# Patient Record
Sex: Male | Born: 1950 | Race: White | Hispanic: No | Marital: Married
Health system: Southern US, Community
[De-identification: ages and names within clinical notes are randomized; demographics above are authoritative.]

---

## 2004-09-15 ENCOUNTER — Ambulatory Visit: Payer: Self-pay | Admitting: Internal Medicine

## 2004-09-17 ENCOUNTER — Ambulatory Visit (HOSPITAL_COMMUNITY): Admission: RE | Admit: 2004-09-17 | Discharge: 2004-09-17 | Payer: Self-pay | Admitting: Internal Medicine

## 2004-10-20 ENCOUNTER — Ambulatory Visit: Payer: Self-pay | Admitting: Internal Medicine

## 2005-04-06 ENCOUNTER — Ambulatory Visit (HOSPITAL_COMMUNITY): Admission: RE | Admit: 2005-04-06 | Discharge: 2005-04-06 | Payer: Self-pay | Admitting: Internal Medicine

## 2005-07-06 ENCOUNTER — Ambulatory Visit: Payer: Self-pay | Admitting: Internal Medicine

## 2005-08-13 ENCOUNTER — Ambulatory Visit (HOSPITAL_COMMUNITY): Admission: RE | Admit: 2005-08-13 | Discharge: 2005-08-13 | Payer: Self-pay | Admitting: Internal Medicine

## 2005-08-13 ENCOUNTER — Ambulatory Visit: Payer: Self-pay | Admitting: Internal Medicine

## 2006-07-26 IMAGING — RF DG ESOPHAGUS
9 of 10 series · 15 of 24 positions shown · non-contrast
Comparison: None

CLINICAL DATA: Recurrent dysphagia.  History of esophageal dilatation.
 DIAGNOSTIC ESOPHAGRAM:

[Series 1: run · 3 of 14 slices shown (1 of 9)]
[im 1/14]
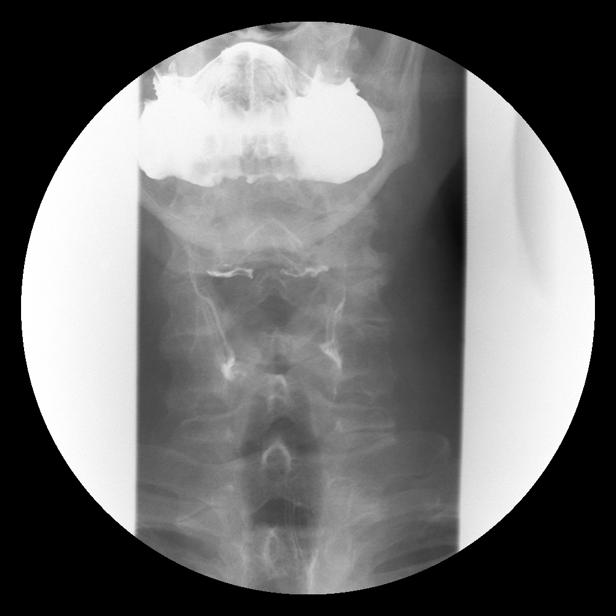
[im 7/14]
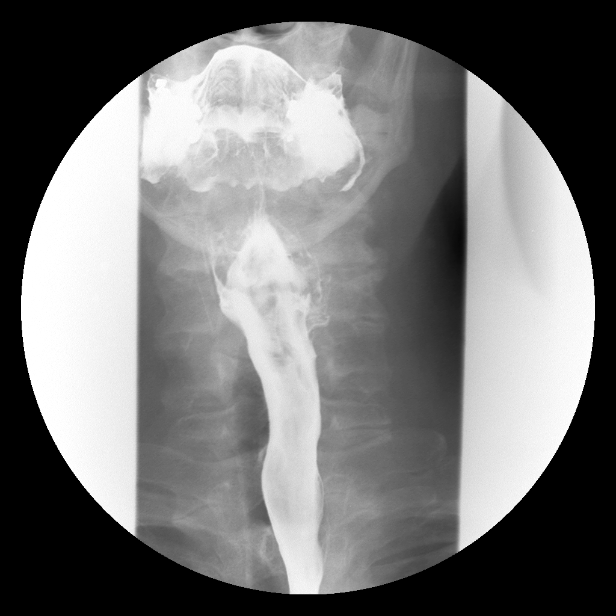
[im 14/14]
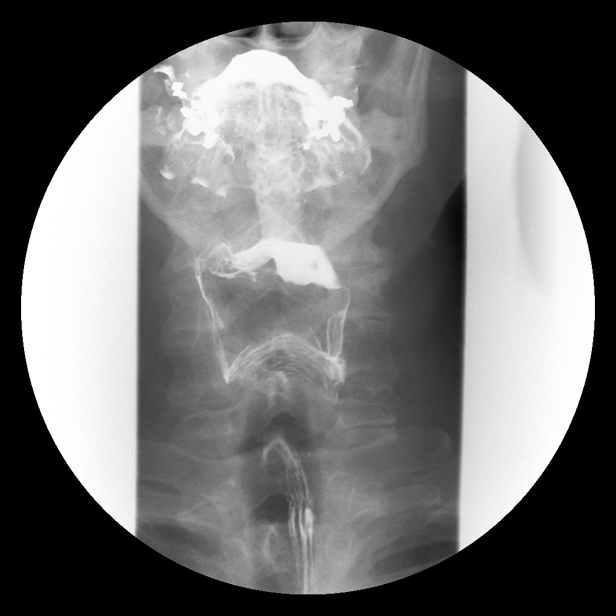

[Series 1: run · 3 of 12 slices shown (2 of 9)]
[im 1/12]
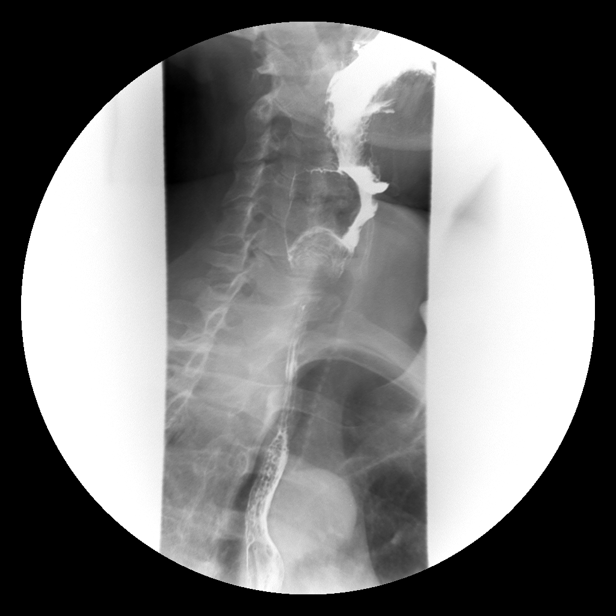
[im 8/12]
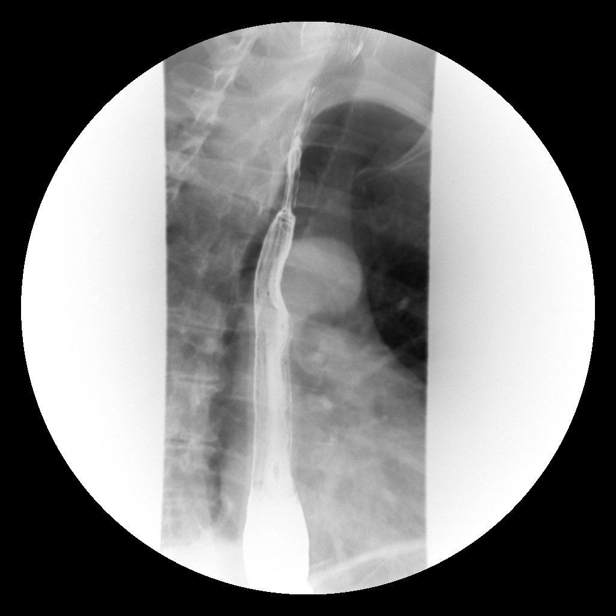
[im 12/12]
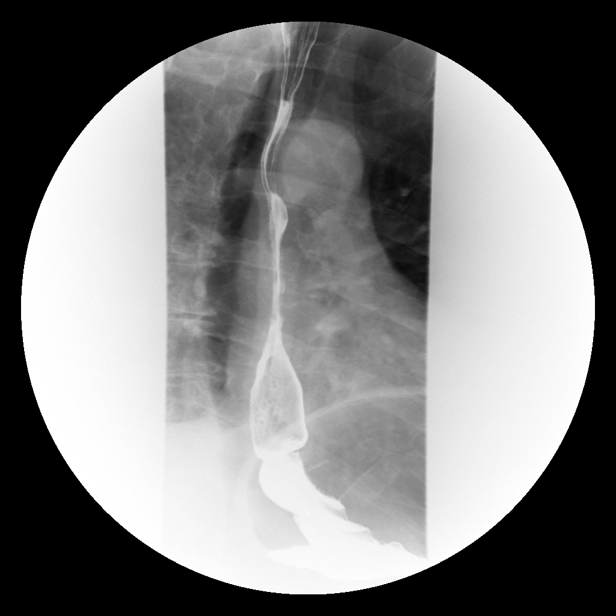

[Series 2: run · 1 of 7 slices shown (3 of 9)]
[im 4/7]
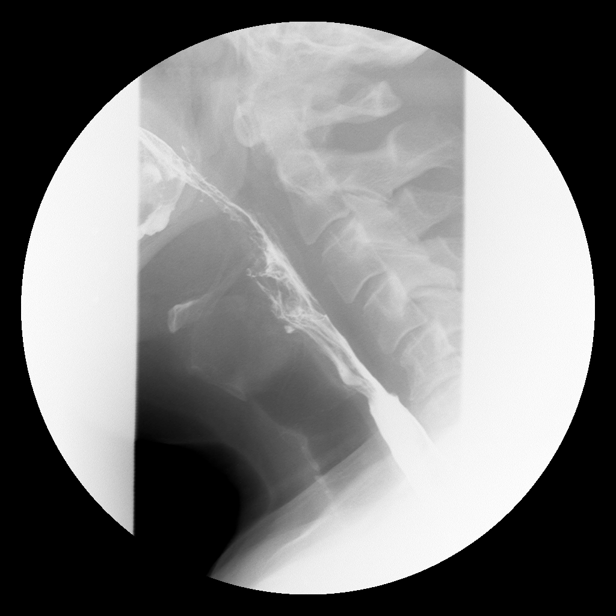

[Series 3: run · 1 of 2 slices shown (4 of 9)]
[im 1/2]
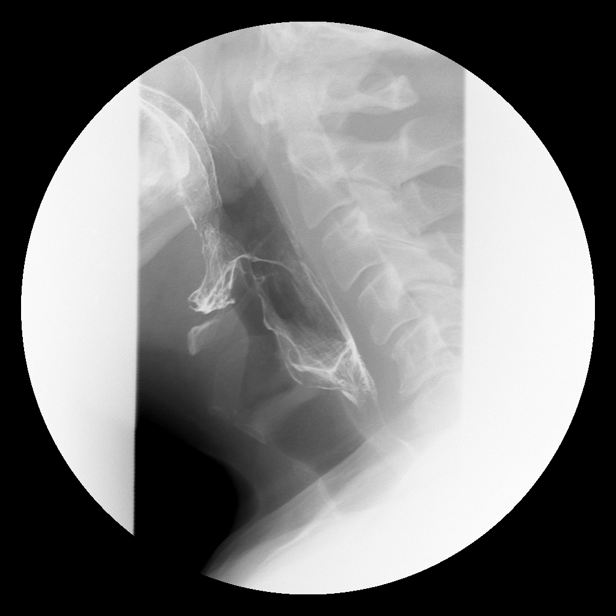

[Series 4: run · 1 of 2 slices shown (5 of 9)]
[im 1/2]
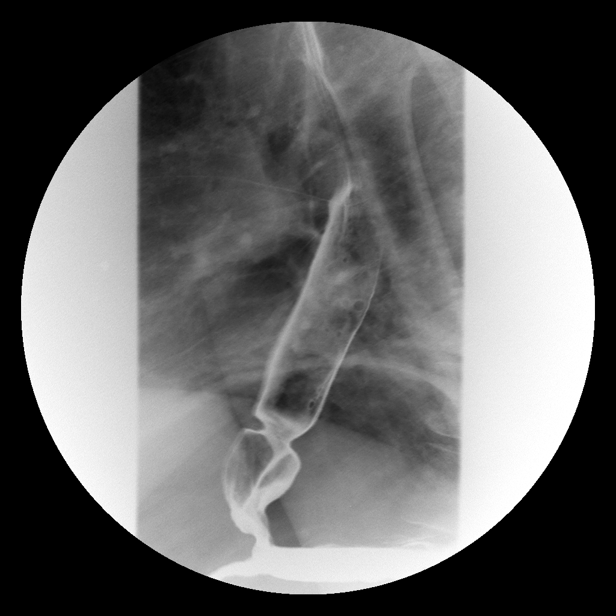

[Series 5: run · 3 of 14 slices shown (6 of 9)]
[im 3/14]
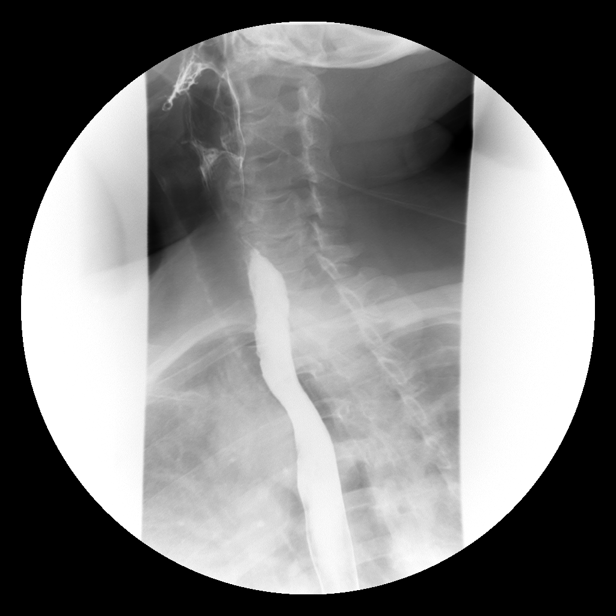
[im 6/14]
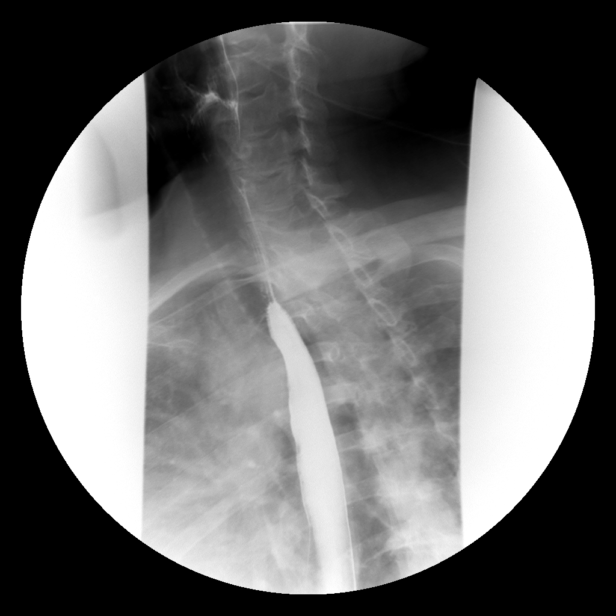
[im 11/14]
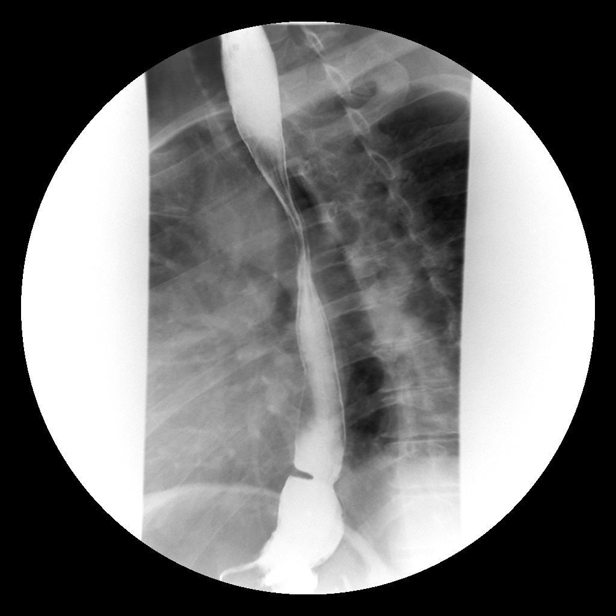

[Series 6: run · 1 of 2 slices shown (7 of 9)]
[im 1/2]
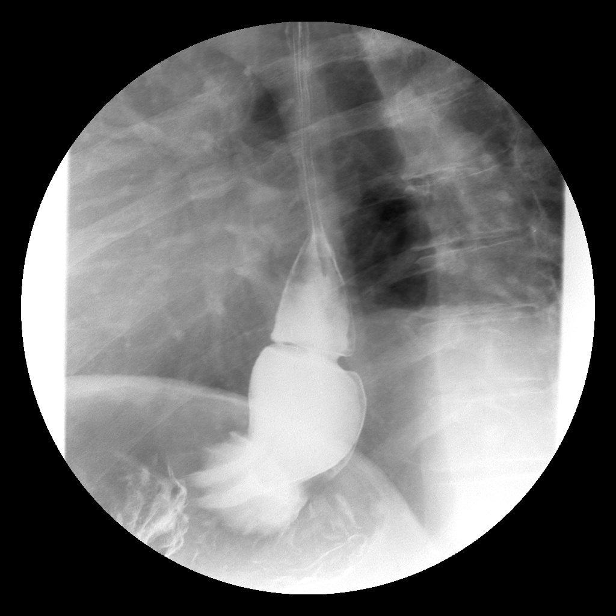

[Series 7: run · 1 of 1 slices shown (8 of 9)]
[im 1/1]
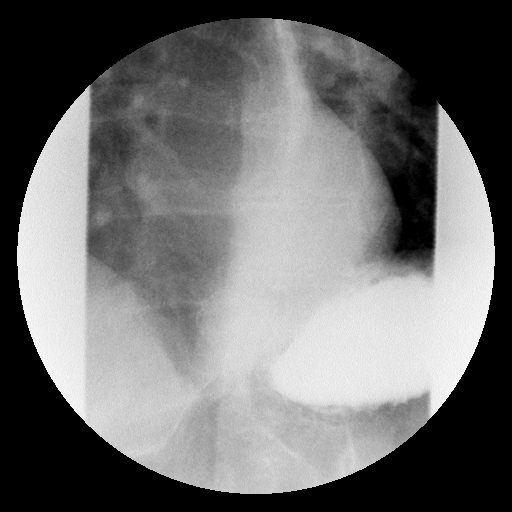

[Series 9: run · 1 of 1 slices shown (9 of 9)]
[im 1/1]
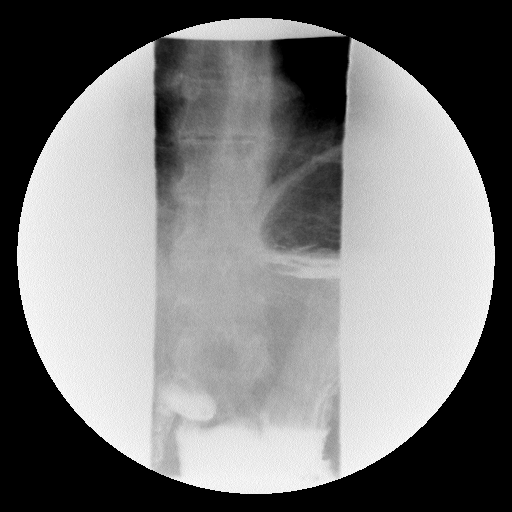

[15 of 24 positions shown; findings below may reference images not displayed]

FINDINGS: The esophagus was studied in single and double contrasting in erect and prone positions.  The esophageal motility is normal.  Spot views of the pharynx are unremarkable.  There is a small reducible hiatal hernia associated with a non-restrictive Schatzki?s ring.  Compared with the diameter of the 13 mm barium tablet, this is estimated to have a diameter of approximately 1.6 cm, and did not restrict the passage of the barium tablet.  There is no evidence of distal esophageal stricture or erosion.  No reflux was elicited with the water siphon test.
IMPRESSION: Hiatal hernia with non-restrictive distal esophageal ring.  No reflux elicited.

## 2015-07-22 ENCOUNTER — Telehealth: Payer: Self-pay | Admitting: Internal Medicine

## 2015-07-22 NOTE — Telephone Encounter (Signed)
Letter mailed to pt.  

## 2015-07-22 NOTE — Telephone Encounter (Signed)
RECALL FOR TCS °
